# Patient Record
Sex: Female | Born: 1940 | Race: White | Hispanic: No | Smoking: Never smoker
Health system: Southern US, Community
[De-identification: ages and names within clinical notes are randomized; demographics above are authoritative.]

## PROBLEM LIST (undated history)

## (undated) DIAGNOSIS — G629 Polyneuropathy, unspecified: Secondary | ICD-10-CM

## (undated) DIAGNOSIS — T7840XA Allergy, unspecified, initial encounter: Secondary | ICD-10-CM

## (undated) DIAGNOSIS — M1990 Unspecified osteoarthritis, unspecified site: Secondary | ICD-10-CM

## (undated) DIAGNOSIS — E079 Disorder of thyroid, unspecified: Secondary | ICD-10-CM

## (undated) DIAGNOSIS — E119 Type 2 diabetes mellitus without complications: Secondary | ICD-10-CM

## (undated) HISTORY — DX: Type 2 diabetes mellitus without complications: E11.9

## (undated) HISTORY — DX: Unspecified osteoarthritis, unspecified site: M19.90

## (undated) HISTORY — DX: Allergy, unspecified, initial encounter: T78.40XA

## (undated) HISTORY — DX: Polyneuropathy, unspecified: G62.9

## (undated) HISTORY — DX: Disorder of thyroid, unspecified: E07.9

---

## 2005-03-23 ENCOUNTER — Ambulatory Visit: Payer: Self-pay | Admitting: Family Medicine

## 2005-03-23 ENCOUNTER — Observation Stay (HOSPITAL_COMMUNITY): Admission: EM | Admit: 2005-03-23 | Discharge: 2005-03-24 | Payer: Self-pay | Admitting: Emergency Medicine

## 2007-03-30 IMAGING — CR DG CHEST 2V
2 series · 2 of 2 positions shown · non-contrast
Comparison: None

CLINICAL DATA: Dizziness.  Altered level of consciousness.
 CHEST ? 2 VIEW:

[view not recorded (1 of 2)]
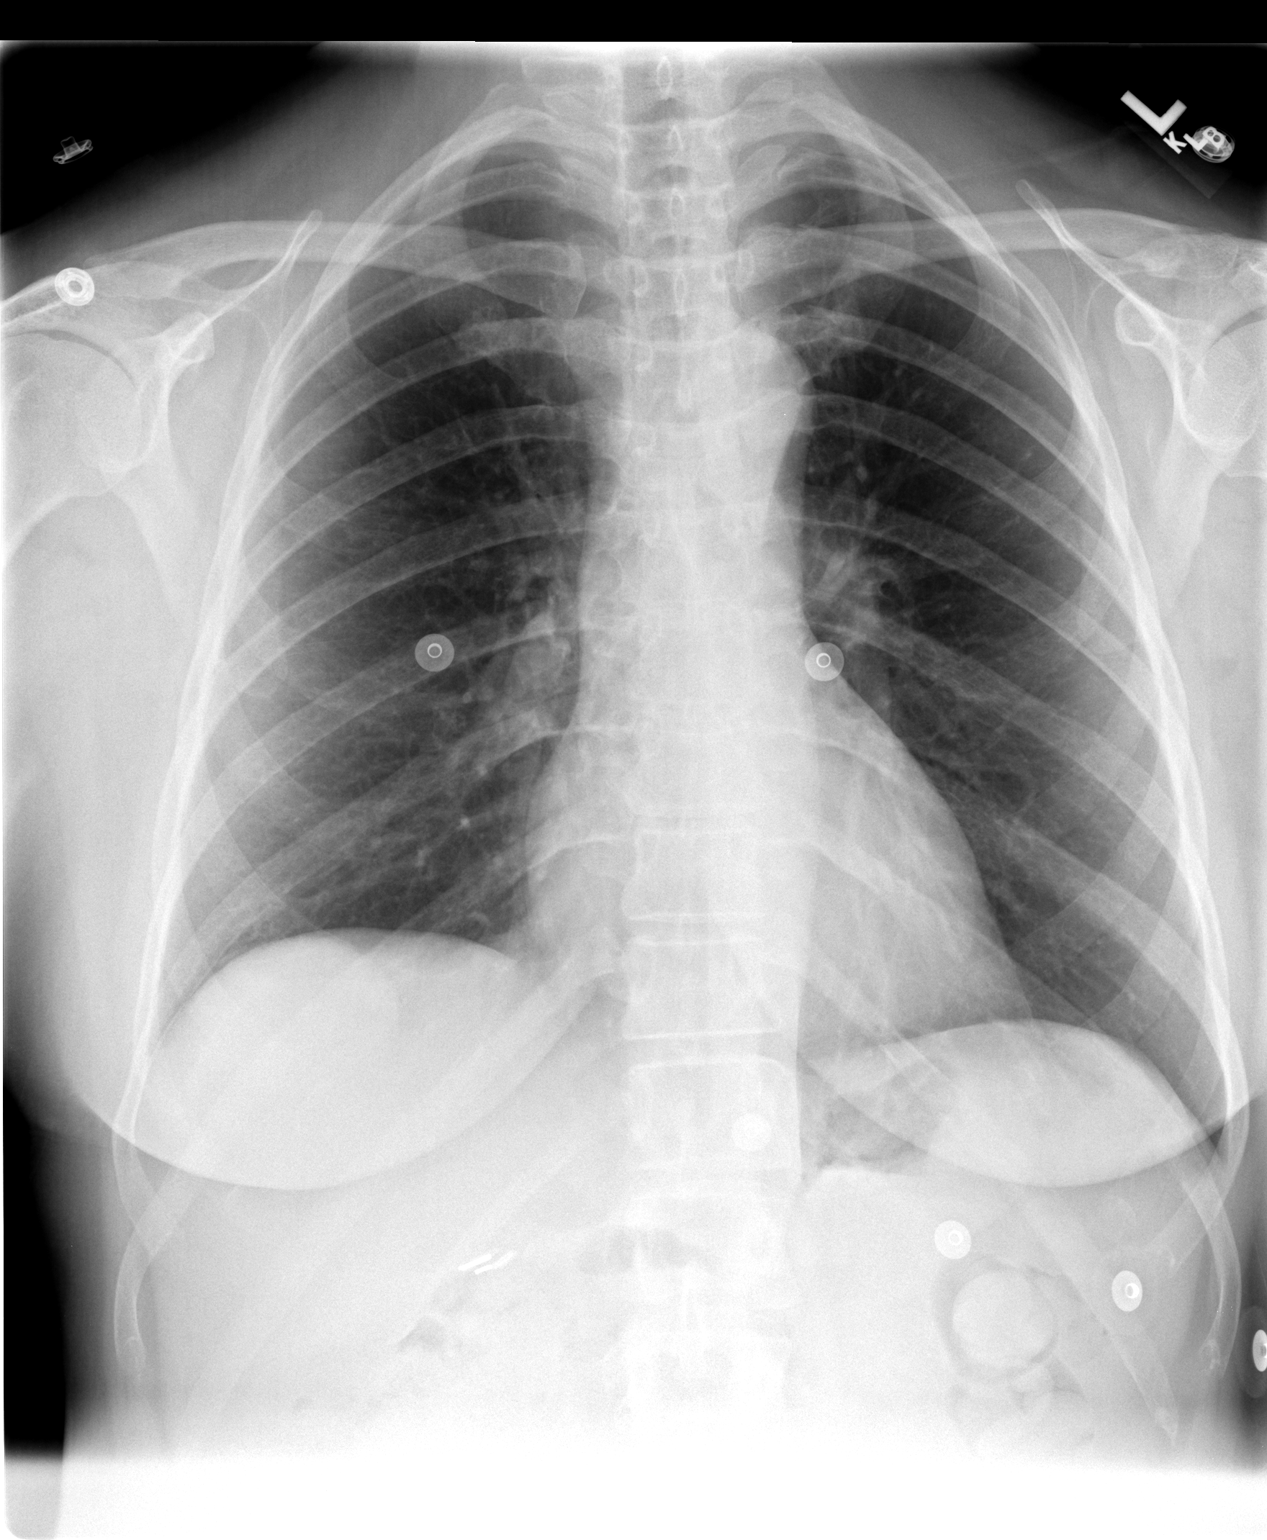

[view not recorded (2 of 2)]
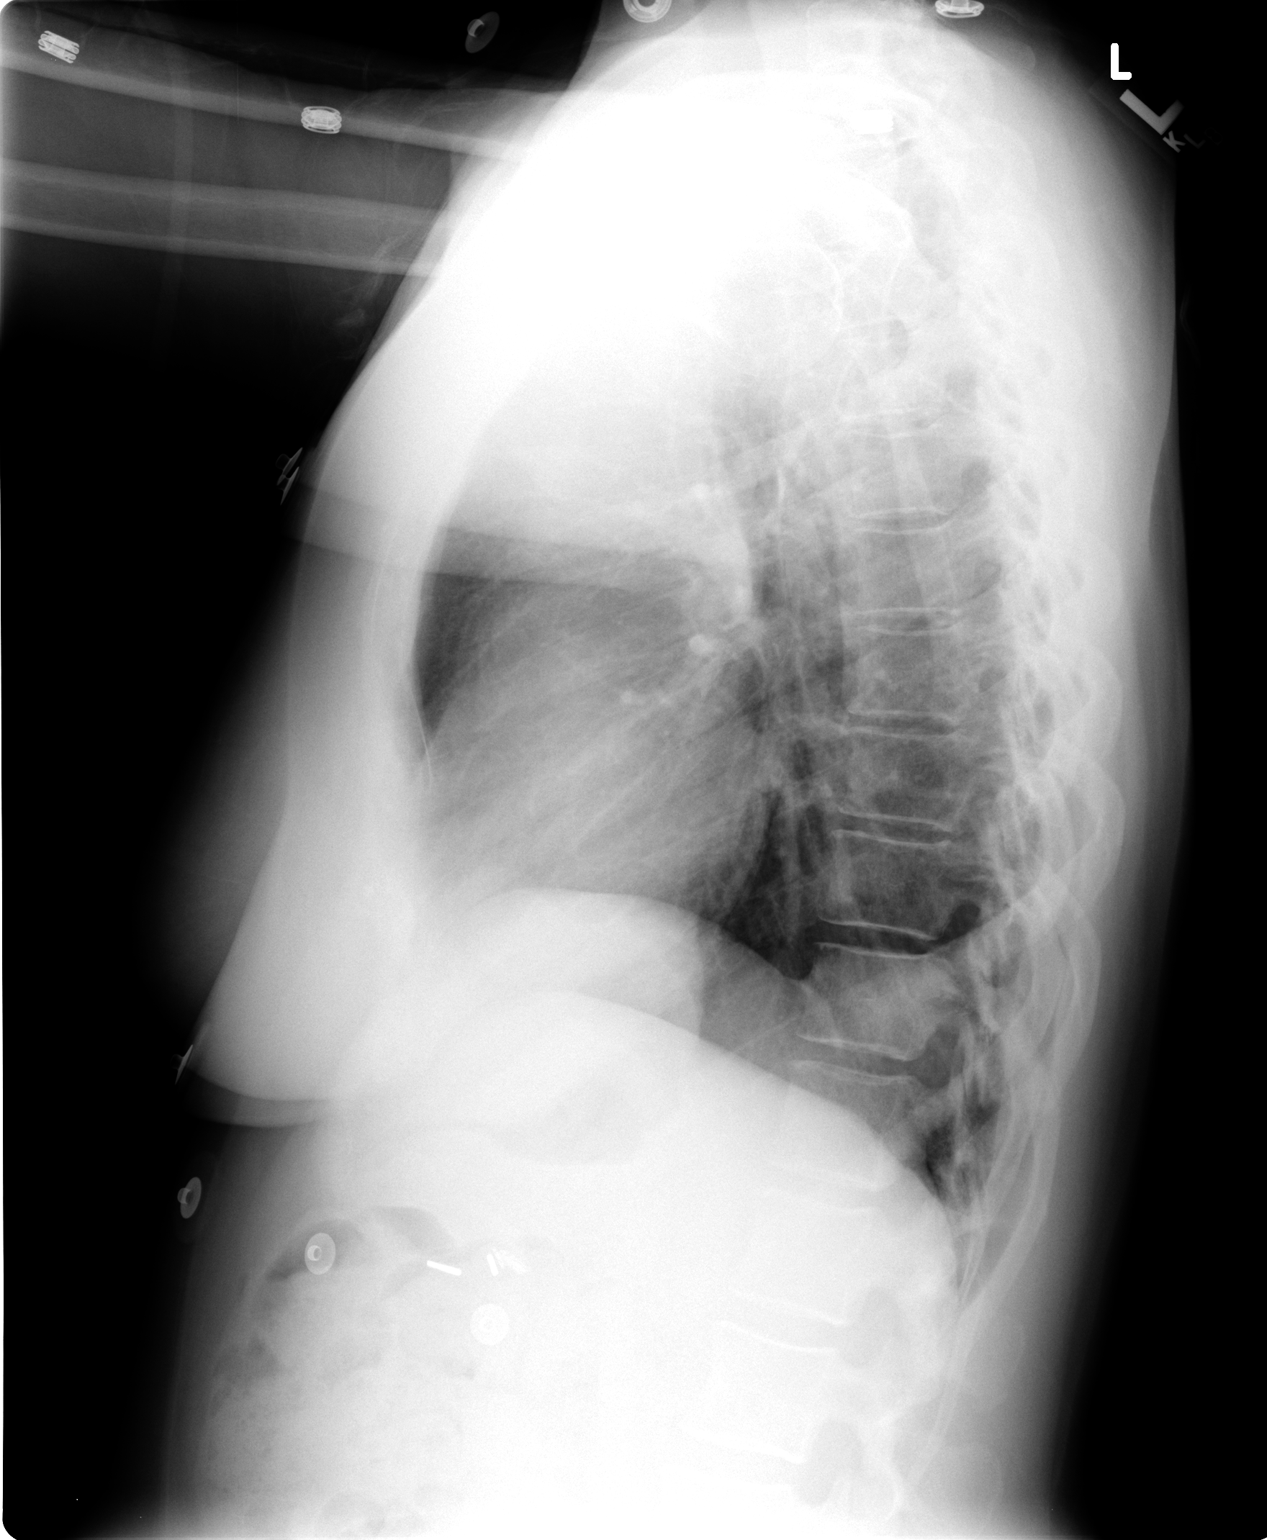

[2 of 2 positions shown; findings below may reference images not displayed]

FINDINGS: The lungs are clear.  The cardio-pericardial silhouette is within normal limits for size.  
 A 4 cm round opacity silhouettes the posterior right hemidiaphragm.  This is probably a diaphragmatic eventration.  Surgical clips in the right upper quadrant suggest prior cholecystectomy.
IMPRESSION: 4 cm opacity obscures the posterior right hemidiaphragm.  While this is probably an eventration, comparison to earlier chest x-rays to confirm stability is recommended.  If the patient has no prior imaging, CT scan of the chest without contrast would be helpful to exclude a mass lesion.

## 2020-08-30 ENCOUNTER — Other Ambulatory Visit: Payer: Self-pay | Admitting: Internal Medicine

## 2020-08-30 DIAGNOSIS — E041 Nontoxic single thyroid nodule: Secondary | ICD-10-CM

## 2020-09-06 ENCOUNTER — Other Ambulatory Visit (HOSPITAL_COMMUNITY)
Admission: RE | Admit: 2020-09-06 | Discharge: 2020-09-06 | Disposition: A | Discharge status: Home / Self Care | Payer: Medicare Other | Source: Ambulatory Visit | Attending: Internal Medicine | Admitting: Internal Medicine

## 2020-09-06 ENCOUNTER — Ambulatory Visit
Admission: RE | Admit: 2020-09-06 | Discharge: 2020-09-06 | Disposition: A | Discharge status: Home / Self Care | Payer: Medicare Other | Source: Ambulatory Visit | Attending: Internal Medicine | Admitting: Internal Medicine

## 2020-09-06 DIAGNOSIS — D34 Benign neoplasm of thyroid gland: Secondary | ICD-10-CM | POA: Insufficient documentation

## 2020-09-06 DIAGNOSIS — E041 Nontoxic single thyroid nodule: Secondary | ICD-10-CM | POA: Diagnosis present

## 2020-09-07 LAB — CYTOLOGY - NON PAP

## 2020-10-19 ENCOUNTER — Ambulatory Visit: Payer: PRIVATE HEALTH INSURANCE | Admitting: Endocrinology

## 2020-12-20 NOTE — Progress Notes (Signed)
Patient ID: Amy Figueroa, female   DOB: 05-Jul-1940, 80 y.o.   MRN: [id number]             Reason for Appointment: Right thyroid nodule    History of Present Illness:   The patient is being referred by her PCP Dr. Mina Marble  The patient's thyroid nodule was first discovered in 07/2020 incidentally on an MRI scan   Her previous history, clinical information, labs, ultrasound reports were reviewed from epic data  At that time she was asymptomatic and still does not complain of any difficulty with swallowing, pressure sensation in the neck, difficulty breathing in any position or neck discomfort She may occasionally get choked while swallowing She has not been told to have a thyroid nodule on exam previously  The thyroid ultrasound done in 08/2020 showed the following Right inferior nodule measuring 5.4 cm; Other 2 dimensions: 2.3 x 3.6 cm, Solid/almost completely solid, Isoechoic, TI-RADS total points:3   Needle aspiration biopsy done on 09/06/2020 showed Bethesda 2 benign follicular nodule  No results found for: FREET4, TSH  Allergies as of 12/21/2020       Reactions   Cefazolin Itching   Benadryl helped   Clindamycin Hives   Sulfa Antibiotics Shortness Of Breath   Other reaction(s): Shortness Of Breath   Chlorpheniramine    Codeine Nausea And Vomiting   Ezetimibe Other (See Comments)   Other reaction(s): Myalgias (intolerance) Muscle cramps Muscle cramps   Mometasone Other (See Comments)   Cough  Cough  Cough  Cough    Pseudoephedrine-naproxen Na Er Itching   Rosuvastatin Other (See Comments)   Other reaction(s): Myalgias (intolerance) Retried in 2022 at 5 mg still not tolerable.        Medication List        Accurate as of December 21, 2020  2:24 PM. If you have any questions, ask your nurse or doctor.          acetaminophen 500 MG tablet Commonly known as: TYLENOL Take 500 mg by mouth as needed. Take 2 tabs as needed.   aspirin 81 MG EC tablet Take  81 mg by mouth at bedtime.   benzonatate 100 MG capsule Commonly known as: TESSALON Take 100 mg by mouth 3 (three) times daily.   celecoxib 200 MG capsule Commonly known as: CELEBREX Take 200 mg by mouth 2 (two) times daily.   cetirizine 10 MG tablet Commonly known as: ZYRTEC Take 10 mg by mouth daily.   clotrimazole-betamethasone cream Commonly known as: LOTRISONE Apply BID for 3-5 day s prn   freestyle lancets Two (2) times a day.   gabapentin 600 MG tablet Commonly known as: NEURONTIN Take by mouth.   lisinopril 2.5 MG tablet Commonly known as: ZESTRIL Take 2.5 mg by mouth daily.   metFORMIN 1000 MG tablet Commonly known as: GLUCOPHAGE Take 1,000 mg by mouth 2 (two) times daily.   multivitamin capsule Take by mouth.   mupirocin ointment 2 % Commonly known as: BACTROBAN SMARTSIG:Swab Topical Twice Daily   nitrofurantoin (macrocrystal-monohydrate) 100 MG capsule Commonly known as: MACROBID Take 100 mg by mouth 2 (two) times daily.   PROBIOTIC DAILY PO Take by mouth.   Ubiquinol 100 MG Caps Take by mouth.        Allergies:  Allergies  Allergen Reactions   Cefazolin Itching    Benadryl helped   Clindamycin Hives   Sulfa Antibiotics Shortness Of Breath    Other reaction(s): Shortness Of Breath   Chlorpheniramine  Codeine Nausea And Vomiting   Ezetimibe Other (See Comments)    Other reaction(s): Myalgias (intolerance) Muscle cramps Muscle cramps    Mometasone Other (See Comments)    Cough  Cough  Cough  Cough     Pseudoephedrine-Naproxen Na Er Itching   Rosuvastatin Other (See Comments)    Other reaction(s): Myalgias (intolerance) Retried in 2022 at 5 mg still not tolerable.     Past Medical History:  Diagnosis Date   Allergy    Arthritis    Diabetes mellitus without complication (HCC)    Neuropathy    Thyroid disease     There is no history of radiation to the neck in childhood  History reviewed. No pertinent surgical  history.  Family History  Problem Relation Age of Onset   Diabetes Brother    Thyroid disease Maternal Aunt    Diabetes Maternal Grandmother     Social History:  reports that she has never smoked. She has never used smokeless tobacco. No history on file for alcohol use and drug use.   Review of Systems  Constitutional:  Negative for weight gain.  HENT:  Negative for trouble swallowing.   Respiratory:  Positive for daytime sleepiness.        Feels like taking a nap every afternoon  Cardiovascular:  Negative for leg swelling.  Gastrointestinal:  Negative for diarrhea.  Endocrine: Negative for fatigue.  Musculoskeletal:  Positive for joint pain.  Neurological:  Positive for tingling.   DIABETES: This was diagnosed around the year 2015 Has been managed with metformin only and last A1c in 09/2020 was 6.2  No hypertension: Appears to be on prophylactic lisinopril long-term  She has peripheral neuropathy of unclear etiology and recently had spinal cord stimulator to treat this   Examination:   BP 110/78   Pulse 87   Ht '5\' 7"'$  (1.702 m)   Wt 166 lb (75.3 kg)   SpO2 98%   BMI 26.00 kg/m    General Appearance:  well-looking        Eyes: No abnormal prominence or swelling of the eyes          THYROID: Thyroid nodule is palpable on the right side, better on swallowing. Nodule is slightly firm, smooth and oval-shaped and measures about 5-6 cm Left lobe is nonpalpable No stridor Trachea is not deviated  There is no lymphadenopathy in the neck   Heart sounds normal Lungs clear Abdomen shows no hepatosplenomegaly or other mass.     Reflexes at biceps are normal.  Skin: No rash or lesions Extremities: No edema  Assessment/Plan:  Right-sided  thyroid nodule: Although this is benign and her needle aspiration showed Bethesda 2 category it is relatively large at 5.4 cm She is asymptomatic Baseline thyroid levels have been normal  Discussed that she likely has had this nodule  for quite some time because of the size Unlikely that this will need any further intervention she is not showing any local pressure symptoms  She will need to be followed up in about a year and we will repeat her ultrasound once to ensure stability   Consultation note sent to the referring physician  Elayne Snare 12/21/2020

## 2020-12-21 ENCOUNTER — Ambulatory Visit (INDEPENDENT_AMBULATORY_CARE_PROVIDER_SITE_OTHER): Payer: Medicare Other | Admitting: Endocrinology

## 2020-12-21 ENCOUNTER — Other Ambulatory Visit: Payer: Self-pay

## 2020-12-21 ENCOUNTER — Encounter: Payer: Self-pay | Admitting: Endocrinology

## 2020-12-21 VITALS — BP 110/78 | HR 87 | Ht 67.0 in | Wt 166.0 lb

## 2020-12-21 DIAGNOSIS — E041 Nontoxic single thyroid nodule: Secondary | ICD-10-CM

## 2021-09-14 ENCOUNTER — Other Ambulatory Visit (INDEPENDENT_AMBULATORY_CARE_PROVIDER_SITE_OTHER): Payer: Medicare Other

## 2021-09-14 DIAGNOSIS — E041 Nontoxic single thyroid nodule: Secondary | ICD-10-CM | POA: Diagnosis not present

## 2021-09-14 LAB — TSH: TSH: 0.79 u[IU]/mL (ref 0.35–5.50)

## 2021-09-14 LAB — T4, FREE: Free T4: 0.61 ng/dL (ref 0.60–1.60)

## 2021-09-20 ENCOUNTER — Encounter: Payer: Self-pay | Admitting: Endocrinology

## 2021-09-20 ENCOUNTER — Ambulatory Visit (INDEPENDENT_AMBULATORY_CARE_PROVIDER_SITE_OTHER): Payer: Medicare Other | Admitting: Endocrinology

## 2021-09-20 VITALS — BP 130/70 | HR 87 | Ht 66.0 in | Wt 167.4 lb

## 2021-09-20 DIAGNOSIS — E041 Nontoxic single thyroid nodule: Secondary | ICD-10-CM

## 2021-09-20 NOTE — Progress Notes (Signed)
Patient ID: Amy Figueroa, female   DOB: 07-10-1940, 81 y.o.   MRN: [id number]             Reason for Appointment: Right thyroid nodule    History of Present Illness:    The patient's thyroid nodule was first discovered in 07/2020 incidentally on an MRI scan  Her previous history, clinical information, labs, ultrasound reports were reviewed from previous notes  She still does not complain of any difficulty with swallowing, pressure sensation in the neck, difficulty breathing in any position or neck discomfort She may occasionally get choked while swallowing food or water   The thyroid ultrasound done in 08/2020 showed the following Right inferior nodule measuring 5.4 cm; Other 2 dimensions: 2.3 x 3.6 cm, Solid/almost completely solid, Isoechoic, TI-RADS total points:3   Needle aspiration biopsy done on 09/06/2020 showed Bethesda 2 benign follicular nodule  Most recent thyroid functions:  Lab Results  Component Value Date   FREET4 0.61 09/14/2021   TSH 0.79 09/14/2021    Allergies as of 09/20/2021       Reactions   Cefazolin Itching   Benadryl helped   Clindamycin Hives   Sulfa Antibiotics Shortness Of Breath   Other reaction(s): Shortness Of Breath   Chlorpheniramine    Codeine Nausea And Vomiting   Ezetimibe Other (See Comments)   Other reaction(s): Myalgias (intolerance) Muscle cramps Muscle cramps   Mometasone Other (See Comments)   Cough  Cough  Cough  Cough    Pseudoephedrine-naproxen Na Er Itching   Rosuvastatin Other (See Comments)   Other reaction(s): Myalgias (intolerance) Retried in 2022 at 5 mg still not tolerable.        Medication List        Accurate as of September 20, 2021  1:16 PM. If you have any questions, ask your nurse or doctor.          acetaminophen 500 MG tablet Commonly known as: TYLENOL Take 500 mg by mouth as needed. Take 2 tabs as needed.   aspirin EC 81 MG tablet Take 81 mg by mouth at bedtime.   benzonatate 100 MG  capsule Commonly known as: TESSALON Take 100 mg by mouth 3 (three) times daily.   celecoxib 200 MG capsule Commonly known as: CELEBREX Take 200 mg by mouth 2 (two) times daily.   cetirizine 10 MG tablet Commonly known as: ZYRTEC Take 10 mg by mouth daily.   clotrimazole-betamethasone cream Commonly known as: LOTRISONE Apply BID for 3-5 day s prn   freestyle lancets Two (2) times a day.   gabapentin 600 MG tablet Commonly known as: NEURONTIN Take by mouth.   lisinopril 2.5 MG tablet Commonly known as: ZESTRIL Take 2.5 mg by mouth daily.   metFORMIN 1000 MG tablet Commonly known as: GLUCOPHAGE Take 1,000 mg by mouth 2 (two) times daily.   multivitamin capsule Take by mouth.   mupirocin ointment 2 % Commonly known as: BACTROBAN SMARTSIG:Swab Topical Twice Daily   nitrofurantoin (macrocrystal-monohydrate) 100 MG capsule Commonly known as: MACROBID Take 100 mg by mouth 2 (two) times daily.   PROBIOTIC DAILY PO Take by mouth.   Ubiquinol 100 MG Caps Take by mouth.        Allergies:  Allergies  Allergen Reactions   Cefazolin Itching    Benadryl helped   Clindamycin Hives   Sulfa Antibiotics Shortness Of Breath    Other reaction(s): Shortness Of Breath   Chlorpheniramine    Codeine Nausea And Vomiting   Ezetimibe Other (See  Comments)    Other reaction(s): Myalgias (intolerance) Muscle cramps Muscle cramps    Mometasone Other (See Comments)    Cough  Cough  Cough  Cough     Pseudoephedrine-Naproxen Na Er Itching   Rosuvastatin Other (See Comments)    Other reaction(s): Myalgias (intolerance) Retried in 2022 at 5 mg still not tolerable.     Past Medical History:  Diagnosis Date   Allergy    Arthritis    Diabetes mellitus without complication (HCC)    Neuropathy    Thyroid disease     There is no history of radiation to the neck in childhood  No past surgical history on file.  Family History  Problem Relation Age of Onset   Diabetes  Brother    Thyroid disease Maternal Aunt    Diabetes Maternal Grandmother     Social History:  reports that she has never smoked. She has never used smokeless tobacco. No history on file for alcohol use and drug use.   Review of Systems  DIABETES: This was diagnosed around the year 2015 Has been managed with metformin only and last A1c  in 3/23 was 6.5   Examination:   BP 130/70   Pulse 87   Ht '5\' 6"'$  (1.676 m)   Wt 167 lb 6.4 oz (75.9 kg)   SpO2 98%   BMI 27.02 kg/m            Right-sided thyroid nodule is slightly firm, smooth and egg-shaped and measures about 5-6 cm Left lobe is not enlarged on exam No stridor Pemberton sign negative   Assessment/Plan:  Right-sided  thyroid nodule:  Discussed that this is benign and her needle aspiration showed Bethesda 2 category She is asymptomatic from any local pressure symptoms Clinically appears to be about the same size Thyroid levels have been normal  Discussed that her overall diagnosis indicates benign pathology and texture of the thyroid nodule feels benign also No change in the size and no separate nodules felt She will follow-up now with her PCP and return as needed   Elayne Snare 09/20/2021

## 2022-09-13 IMAGING — US US FNA BIOPSY THYROID 1ST LESION
1 series · 13 of 17 positions shown · non-contrast
Comparison: Ultrasound 08/24/2020

MEDICATIONS:
Lidocaine 1% subcutaneous

COMPLICATIONS:
None immediate.

INDICATION: Indeterminate thyroid nodule

EXAM:
ULTRASOUND GUIDED FINE NEEDLE ASPIRATION OF INDETERMINATE THYROID
NODULE
TECHNIQUE: Informed written consent was obtained from the patient after a
discussion of the risks, benefits and alternatives to treatment.
Questions regarding the procedure were encouraged and answered. A
timeout was performed prior to the initiation of the procedure.

[Series 1: us fna biopsy thyroid 1st lesion · 0.07mm/px · 17 acquisitions, 13 frames shown]
[im 1/17]
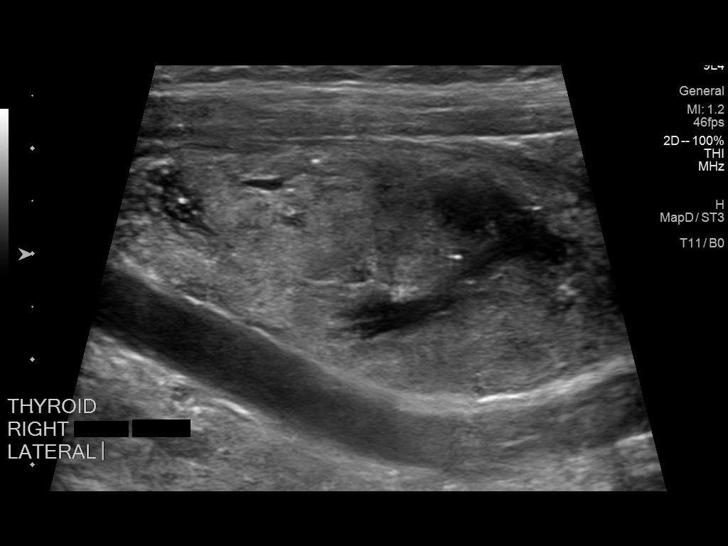
[im 2/17]
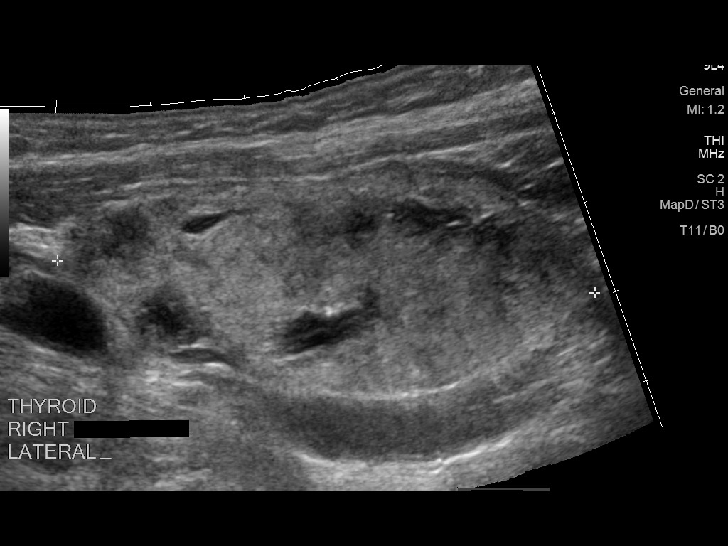
[im 4/17]
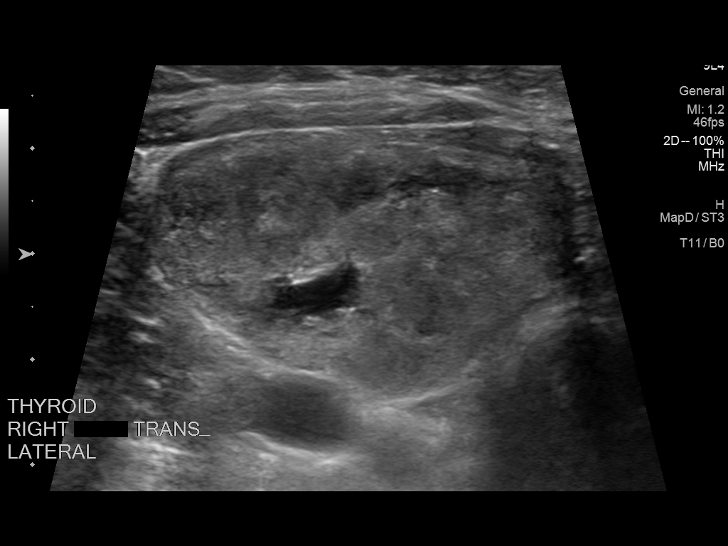
[im 5/17]
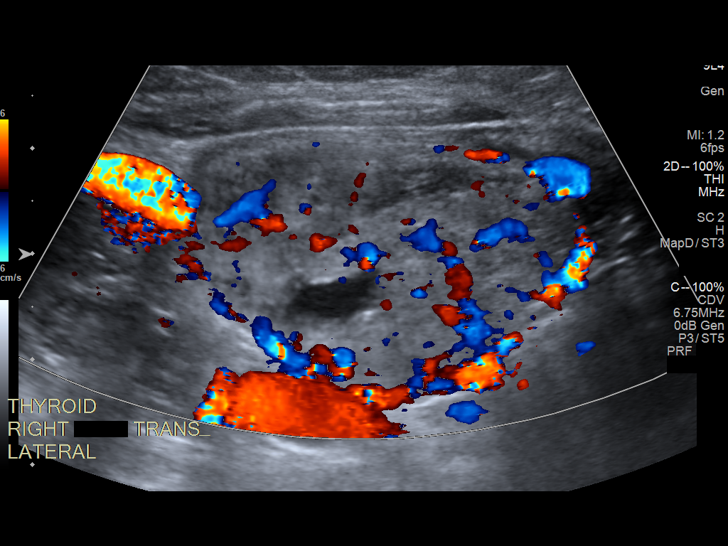
[im 6/17]
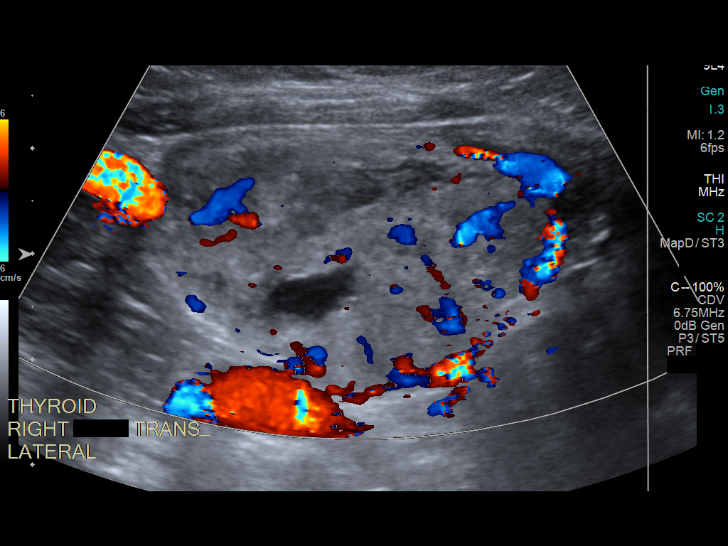
[im 8/17]
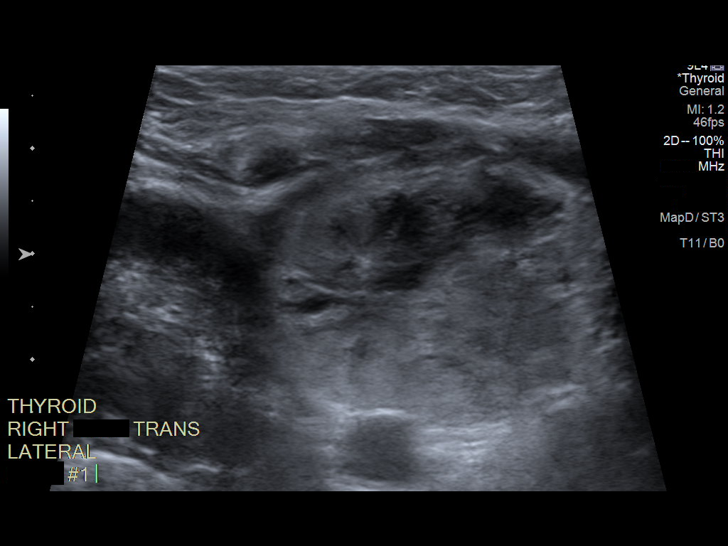
[im 9/17]
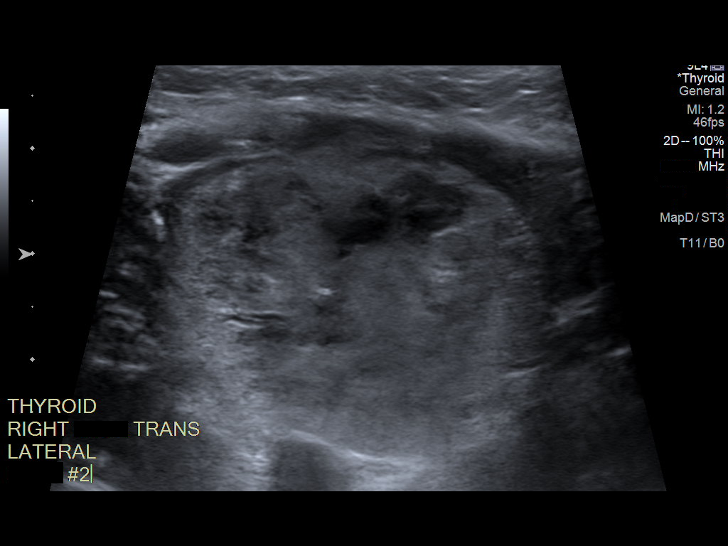
[im 10/17]
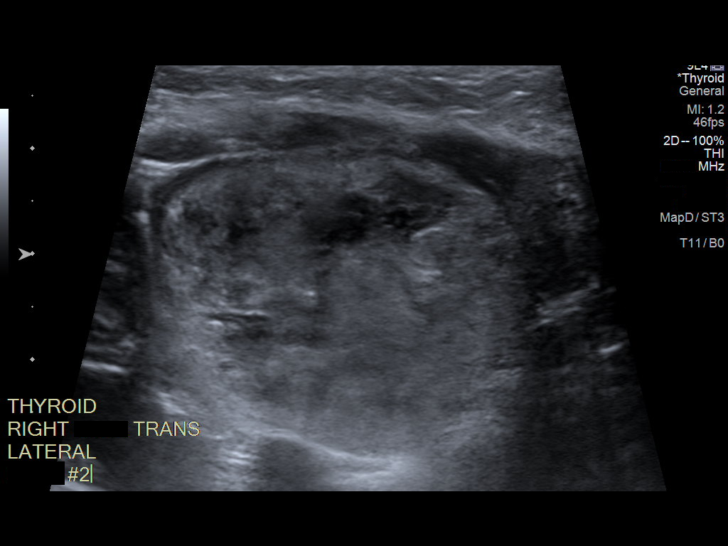
[im 12/17]
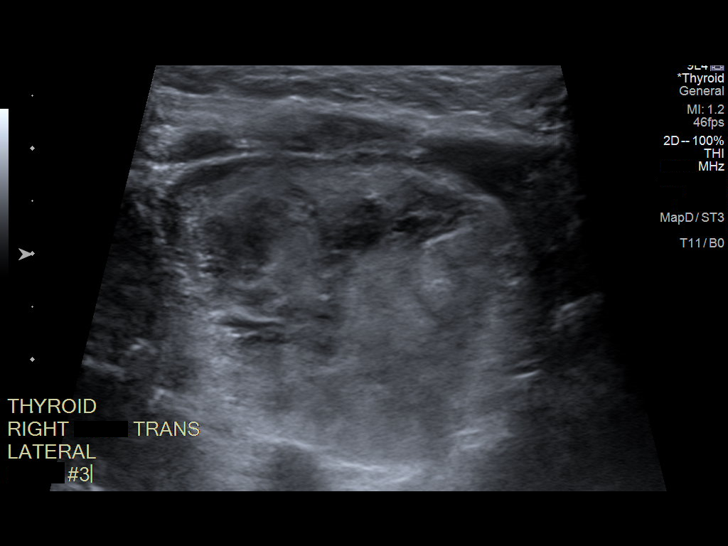
[im 13/17]
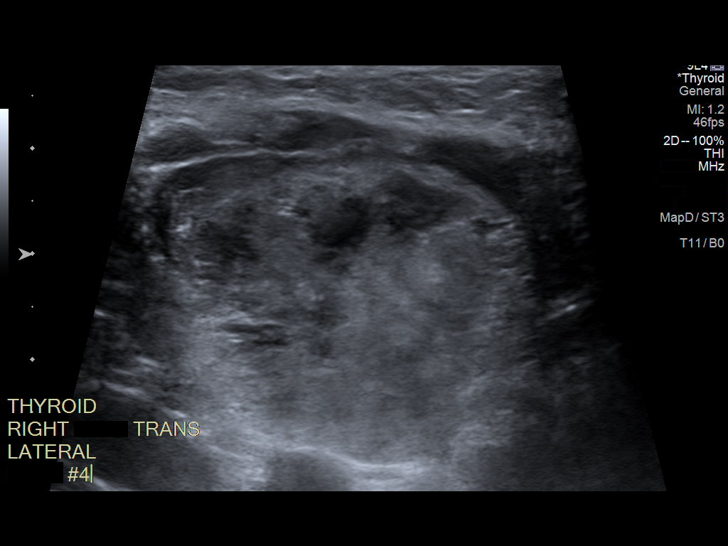
[im 14/17]
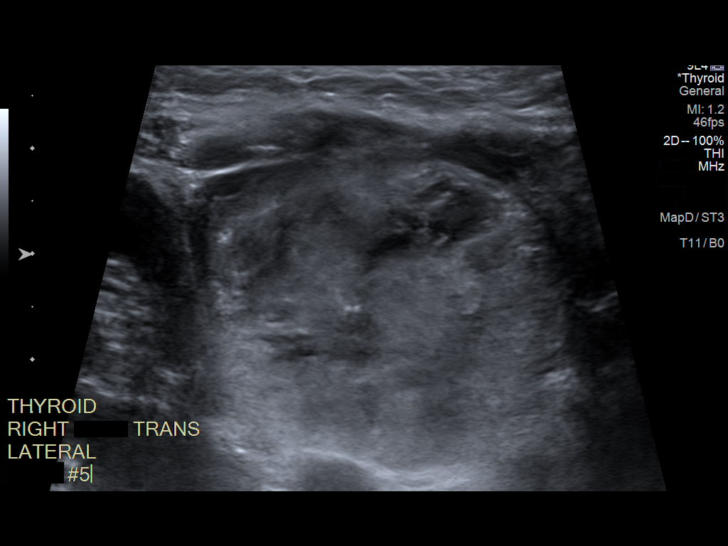
[im 16/17]
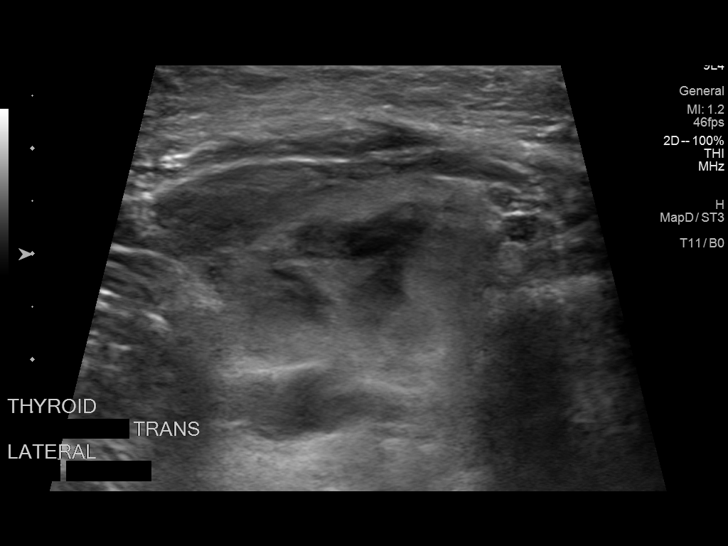
[im 17/17]
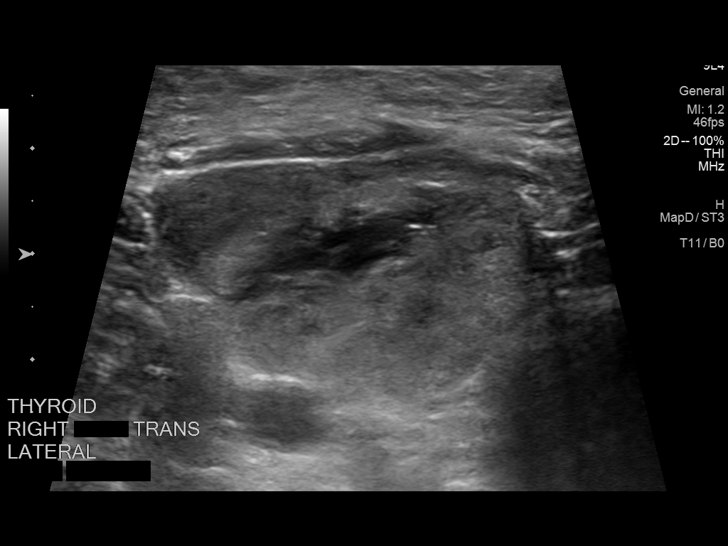

[13 of 17 positions shown; findings below may reference images not displayed]

Pre-procedural ultrasound scanning demonstrated unchanged size and
appearance of the indeterminate nodule within the inferior right
lobe.

The procedure was planned. The neck was prepped in the usual sterile
fashion, and a sterile drape was applied covering the operative
field. A timeout was performed prior to the initiation of the
procedure. Local anesthesia was provided with 1% lidocaine.

Under direct ultrasound guidance, 5 FNA biopsies were performed of
the inferior right nodule with a 25 gauge needle. Multiple
ultrasound images were saved for procedural documentation purposes.
The samples were prepared and submitted to pathology.

Limited post procedural scanning was negative for hematoma or
additional complication. Dressings were placed. The patient
tolerated the above procedures procedure well without immediate
postprocedural complication.
FINDINGS: Nodule reference number based on prior diagnostic ultrasound: 1

Maximum size: 5.4 cm

Location: Right; inferior

ACR TI-RADS risk category: TR3 (3 points)

Reason for biopsy: meets ACR TI-RADS criteria

Ultrasound imaging confirms appropriate placement of the needles
within the thyroid nodule.
IMPRESSION: Technically successful ultrasound guided fine needle aspiration of
5.4 cm inferior right TR3 thyroid nodule.

## 2023-01-07 ENCOUNTER — Encounter: Payer: Self-pay | Admitting: Physical Medicine and Rehabilitation

## 2023-02-11 ENCOUNTER — Encounter: Payer: Medicare Other | Admitting: Physical Medicine and Rehabilitation

## 2024-02-12 ENCOUNTER — Encounter: Payer: Self-pay | Admitting: Physical Medicine and Rehabilitation

## 2024-02-23 ENCOUNTER — Encounter: Payer: Self-pay | Admitting: Physical Medicine and Rehabilitation

## 2024-04-01 ENCOUNTER — Encounter: Attending: Physical Medicine and Rehabilitation | Admitting: Physical Medicine and Rehabilitation

## 2024-04-01 ENCOUNTER — Encounter: Payer: Self-pay | Admitting: Physical Medicine and Rehabilitation

## 2024-04-01 VITALS — BP 133/80 | HR 74 | Ht 66.0 in | Wt 150.6 lb

## 2024-04-01 DIAGNOSIS — G6289 Other specified polyneuropathies: Secondary | ICD-10-CM | POA: Diagnosis not present

## 2024-04-01 DIAGNOSIS — G8929 Other chronic pain: Secondary | ICD-10-CM | POA: Diagnosis not present

## 2024-04-01 DIAGNOSIS — M5442 Lumbago with sciatica, left side: Secondary | ICD-10-CM | POA: Insufficient documentation

## 2024-04-01 MED ORDER — NALTREXONE HCL (PAIN) 1.5 MG PO CAPS: 1.5000 mg | ORAL_CAPSULE | Freq: Every day | ORAL | 3 refills | Status: AC

## 2024-04-01 MED ORDER — NALTREXONE HCL (PAIN) 1.5 MG PO CAPS: 1.0000 | ORAL_CAPSULE | Freq: Every evening | ORAL | 3 refills | Status: DC

## 2024-04-01 NOTE — Progress Notes (Addendum)
 Subjective:    Patient ID: Amy Figueroa, female    DOB: [birth year], 83 y.o.   MRN: [id number]  HPI Mrs. Darley is a an 83 year old woman who presents to establish care for neuropathy  1) Peripheral neuropathy: -Qutenza and Lyrica worked very well for her -she was told by neurology that if she wanted to do Qutenza again her copay would be $700 -she had two falls shortly after starting on cymbalta -she follows with Dr. Constantino who prescribes her Lyrica for her -she was interested in [employer]  2) Low back pain: -spinal cord stimulator helped -they tried to adjust the spinal cord stimulator -she had injections in the past but they do not last  Pain Inventory Average Pain 3 Pain Right Now 3 My pain is constant, stabbing, and tingling  In the last 24 hours, has pain interfered with the following? General activity 2 Relation with others 7 Enjoyment of life 10 What TIME of day is your pain at its worst? evening and night Sleep (in general) NA  Pain is worse with: sitting and standing Pain improves with: heat/ice, medication, and injections Relief from Meds: 6  use a cane ability to climb steps?  yes do you drive?  yes  retired I need assistance with the following:  meal prep, household duties, and shopping  bladder control problems weakness tingling  Any changes since last visit?  no  Any changes since last visit?  no    Family History  Problem Relation Age of Onset   Diabetes Brother    Thyroid  disease Maternal Aunt    Diabetes Maternal Grandmother    Social History   Socioeconomic History   Marital status: Divorced    Spouse name: Not on file   Number of children: Not on file   Years of education: Not on file   Highest education level: Not on file  Occupational History   Not on file  Tobacco Use   Smoking status: Never   Smokeless tobacco: Never  Substance and Sexual Activity   Alcohol use: Not on file   Drug use: Not on file   Sexual  activity: Not on file  Other Topics Concern   Not on file  Social History Narrative   Not on file   Social Drivers of Health   Tobacco Use: Low Risk (04/01/2024)   Patient History    Smoking Tobacco Use: Never    Smokeless Tobacco Use: Never    Passive Exposure: Not on file  Financial Resource Strain: Not on file  Food Insecurity: Low Risk (03/12/2024)   Received from [facility]   Epic    Within the past 12 months, the food you bought just didn't last and you didn't have money to get more. : Never true    Within the past 12 months, you worried that your food would run out before you got money to buy more: Never true  Transportation Needs: No Transportation Needs (03/12/2024)   Received from [facility]    In the past 12 months, has lack of reliable transportation kept you from medical appointments, meetings, work or from getting things needed for daily living? : No  Physical Activity: Not on file  Stress: Not on file  Social Connections: Not on file  Depression (PHQ2-9): Medium Risk (04/01/2024)   Depression (PHQ2-9)    PHQ-2 Score: 6  Alcohol Screen: Not on file  Housing: Low Risk (03/12/2024)   Received from [facility]  Epic    Think about the place you live. Do you have problems with any of the following? Choose all that apply:: None/None on this list    What is your living situation today?: I have a steady place to live  Utilities: Low Risk (03/12/2024)   Received from [facility]   Utilities    In the past 12 months has the electric, gas, oil, or water company threatened to shut off services in your home? : No  Health Literacy: Not on file   No past surgical history on file. Past Medical History:  Diagnosis Date   Allergy    Arthritis    Diabetes mellitus without complication (HCC)    Neuropathy    Thyroid  disease    BP 133/80   Pulse 74   Ht 5' 6 (1.676 m)   Wt 150 lb 9.6 oz (68.3 kg)   SpO2 96%   BMI 24.31 kg/m   Opioid  Risk Score:   Fall Risk Score:  `1  Depression screen [facility] 2/9     04/01/2024   10:18 AM  Depression screen PHQ 2/9  Decreased Interest 0  Down, Depressed, Hopeless 0  PHQ - 2 Score 0  Altered sleeping 0  Tired, decreased energy 2  Change in appetite 1  Feeling bad or failure about yourself  0  Trouble concentrating 3  Moving slowly or fidgety/restless 0  Suicidal thoughts 0  PHQ-9 Score 6  Difficult doing work/chores Somewhat difficult     Review of Systems  Genitourinary:        Bladder control  Musculoskeletal:  Positive for gait problem.  Neurological:  Positive for weakness.       Tingling  All other systems reviewed and are negative.      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   -continue lyrica 150mg  TID, discussed that she feels impaired cognition from this  -discussed topical capsaicin  -Discussed Qutenza as an option for neuropathic pain control. Discussed that this is a capsaicin patch, stronger than capsaicin cream. Discussed that it is currently approved for diabetic peripheral neuropathy and post-herpetic neuralgia, but that it has also shown benefit in treating other forms of neuropathy. Provided patient with link to site to learn more about the patch: https://www.clark.biz/. Discussed that the patch would be placed in office and benefits usually last 3 months. Discussed that unintended exposure to capsaicin can cause severe irritation of eyes, mucous membranes, respiratory tract, and skin, but that Qutenza is a local treatment and does not have the systemic side  effects of other nerve medications. Discussed that there may be pain, itching, erythema, and decreased sensory function associated with the application of Qutenza. Side effects usually subside within 1 week. A cold pack of analgesic medications can help with these side effects. Blood pressure can also be increased due to pain associated with administration of the patch.  We are recommending Qutenza 8% capsaicin to treat this patient's pain. Qutenza is the first-line treatment option recommended for diabetic peripheral neuropathy by the AACE and ADA.   Qutenza is a safer option for this patient due to the  following: Gabapentin use has been associated with increased risk of dementia Lyrica use has been associated with increased risk of heart failure Cymblata, Venlafaxine, and other SSRIs/SNRIs are associated with increased weight gain Lidocaine 5% has been prescribed/tried   -discussed mechanism of action of low dose naltrexone as an opioid receptor antagonist which stimulates your body's production of its own natural endogenous opioids, helping to decrease pain. Discussed that it can also decrease T cell response and thus be helpful in decreasing inflammation, and symptoms of brain fog, fatigue, anxiety, depression, and allergies. Discussed that this medication needs to be compounded at a compounding pharmacy and can more expensive. Discussed that I usually start at 1mg  and if this is not providing enough relief then I titrate upward on a monthly basis.    -Discussed benefits of exercise in reducing pain. -Discussed following foods that may reduce pain: 1) Ginger (especially studied for arthritis)- reduce leukotriene production to decrease inflammation 2) Blueberries- high in phytonutrients that decrease inflammation 3) Salmon- marine omega-3s reduce joint swelling and pain 4) Pumpkin seeds- reduce inflammation 5) dark chocolate- reduces inflammation 6) turmeric- reduces inflammation 7) tart  cherries - reduce pain and stiffness 8) extra virgin olive oil - its compound olecanthal helps to block prostaglandins  9) chili peppers- can be eaten or applied topically via capsaicin 10) mint- helpful for headache, muscle aches, joint pain, and itching 11) garlic- reduces inflammation 12) Green tea- reduces inflammation and oxidative stress, helps with weight loss, may reduce the risk of cancer, recommend Double Green Matcha [place] of Tea daily  Link to further information on diet for chronic pain: http://www.bray.com/   2) Low back pain: -discussed that her spinal simulator helps

## 2024-04-01 NOTE — Progress Notes (Signed)
 New order sent to [facility] for the compounded Naltrexone. [facility] pharmacy does not compound medication.

## 2024-04-01 NOTE — Patient Instructions (Signed)
 Chronic Pain Syndrome secondary to___ -Discussed current symptoms of pain and history of pain.  -Discussed benefits of exercise in reducing pain. -Discussed following foods that may reduce pain: 1) Ginger (especially studied for arthritis)- reduce leukotriene production to decrease inflammation 2) Blueberries- high in phytonutrients that decrease inflammation 3) Salmon- marine omega-3s reduce joint swelling and pain 4) Pumpkin seeds- reduce inflammation 5) dark chocolate- reduces inflammation 6) turmeric- reduces inflammation 7) tart cherries - reduce pain and stiffness 8) extra virgin olive oil - its compound olecanthal helps to block prostaglandins  9) chili peppers- can be eaten or applied topically via capsaicin 10) mint- helpful for headache, muscle aches, joint pain, and itching 11) garlic- reduces inflammation 12) Green tea- reduces inflammation and oxidative stress, helps with weight loss, may reduce the risk of cancer, recommend Double Green Matcha [place] of Tea daily  Link to further information on diet for chronic pain: [email or web address].bray.[email or web address]

## 2024-04-01 NOTE — Addendum Note (Signed)
 Addended by: Keltin Baird W on: 04/01/2024 12:51 PM   Modules accepted: Orders

## 2024-07-08 ENCOUNTER — Encounter: Admitting: Physical Medicine and Rehabilitation
